# Patient Record
Sex: Male | Born: 1980 | Race: Black or African American | Hispanic: No | Marital: Single | Smoking: Former smoker
Health system: Southern US, Community
[De-identification: ages and names within clinical notes are randomized; demographics above are authoritative.]

## PROBLEM LIST (undated history)

## (undated) HISTORY — PX: FRACTURE SURGERY: SHX138

---

## 2015-04-18 ENCOUNTER — Encounter (HOSPITAL_COMMUNITY): Payer: Self-pay | Admitting: *Deleted

## 2015-04-18 ENCOUNTER — Emergency Department (HOSPITAL_COMMUNITY)
Admission: EM | Admit: 2015-04-18 | Discharge: 2015-04-18 | Disposition: A | Discharge status: Home / Self Care | Attending: Emergency Medicine | Admitting: Emergency Medicine

## 2015-04-18 DIAGNOSIS — W51XXXA Accidental striking against or bumped into by another person, initial encounter: Secondary | ICD-10-CM | POA: Insufficient documentation

## 2015-04-18 DIAGNOSIS — Z87891 Personal history of nicotine dependence: Secondary | ICD-10-CM | POA: Diagnosis not present

## 2015-04-18 DIAGNOSIS — Y9367 Activity, basketball: Secondary | ICD-10-CM | POA: Insufficient documentation

## 2015-04-18 DIAGNOSIS — Y92310 Basketball court as the place of occurrence of the external cause: Secondary | ICD-10-CM | POA: Insufficient documentation

## 2015-04-18 DIAGNOSIS — S0993XA Unspecified injury of face, initial encounter: Secondary | ICD-10-CM | POA: Diagnosis present

## 2015-04-18 DIAGNOSIS — S01111A Laceration without foreign body of right eyelid and periocular area, initial encounter: Secondary | ICD-10-CM | POA: Insufficient documentation

## 2015-04-18 DIAGNOSIS — Y998 Other external cause status: Secondary | ICD-10-CM | POA: Insufficient documentation

## 2015-04-18 MED ORDER — LIDOCAINE-EPINEPHRINE (PF) 1 %-1:200000 IJ SOLN
30.0000 mL | Freq: Once | INTRAMUSCULAR | Status: AC
  Administered 2015-04-18: 30 mL

## 2015-04-18 MED ORDER — ACETAMINOPHEN 325 MG PO TABS
650.0000 mg | ORAL_TABLET | Freq: Once | ORAL | Status: AC
  Administered 2015-04-18: 650 mg via ORAL
  Filled 2015-04-18: qty 2

## 2015-04-18 MED ORDER — LIDOCAINE-EPINEPHRINE (PF) 1 %-1:200000 IJ SOLN
INTRAMUSCULAR | Status: AC
  Administered 2015-04-18: 30 mL
  Filled 2015-04-18: qty 10

## 2015-04-18 MED ORDER — KETOROLAC TROMETHAMINE 10 MG PO TABS
10.0000 mg | ORAL_TABLET | Freq: Once | ORAL | Status: AC
  Administered 2015-04-18: 10 mg via ORAL
  Filled 2015-04-18: qty 1

## 2015-04-18 NOTE — ED Notes (Addendum)
Lac to rt eyebrow region, bumped heads with another person when playing basketball.  No LOC, no other injury  Pt is a prisoner with a guard

## 2015-04-18 NOTE — ED Provider Notes (Signed)
CSN: [id number]     Arrival date & time 04/18/15  2028 History   First MD Initiated Contact with Patient 04/18/15 2042     Chief Complaint  Patient presents with  . Facial Laceration     (Consider location/radiation/quality/duration/timing/severity/associated sxs/prior Treatment) HPI Comments: Patient is a 34 year old male resident of a local prison who presents to the emergency department in the custody of one of the prison guards. The patient states that he was playing basketball, when he bumped heads with another person and sustained a laceration to the right eyebrow. He denies loss of consciousness. He denies any vision changes. He states he has some mild soreness about the side of the face, but no other problems. There are no other lacerations or injuries noted. Patient states that his last tetanus shot has been within the last 5 years. Patient denies any bleeding disorders, and is not on any anticoagulation medications.  The history is provided by the patient.    History reviewed. No pertinent past medical history. Past Surgical History  Procedure Laterality Date  . Fracture surgery     History reviewed. No pertinent family history. History  Substance Use Topics  . Smoking status: Former Games developermoker  . Smokeless tobacco: Not on file  . Alcohol Use: No    Review of Systems  Constitutional: Negative for activity change.       All ROS Neg except as noted in HPI  HENT: Negative for nosebleeds.   Eyes: Negative for photophobia and discharge.  Respiratory: Negative for cough, shortness of breath and wheezing.   Cardiovascular: Negative for chest pain and palpitations.  Gastrointestinal: Negative for abdominal pain and blood in stool.  Genitourinary: Negative for dysuria, frequency and hematuria.  Musculoskeletal: Negative for back pain, arthralgias and neck pain.  Skin: Negative.   Neurological: Negative for dizziness, seizures and speech difficulty.  Psychiatric/Behavioral:  Negative for hallucinations and confusion.      Allergies  Review of patient's allergies indicates no known allergies.  Home Medications   Prior to Admission medications   Not on File   BP 126/86 mmHg  Pulse 91  Temp(Src) 98.8 F (37.1 C) (Oral)  Resp 18  Ht 5\' 9"  (1.753 m)  Wt 234 lb (106.142 kg)  BMI 34.54 kg/m2  SpO2 98% Physical Exam  Constitutional: He is oriented to person, place, and time. He appears well-developed and well-nourished.  Non-toxic appearance.  HENT:  Head: Normocephalic. Head is with laceration. Head is without raccoon's eyes, without Battle's sign, without right periorbital erythema and without left periorbital erythema.    Right Ear: Tympanic membrane and external ear normal.  Left Ear: Tympanic membrane and external ear normal.  Eyes: EOM and lids are normal. Pupils are equal, round, and reactive to light.  The globe is firm. The anterior chamber is clear. The extraocular movements are intact.  Neck: Normal range of motion. Neck supple. Carotid bruit is not present.  Cardiovascular: Normal rate, regular rhythm, normal heart sounds, intact distal pulses and normal pulses.   Pulmonary/Chest: Breath sounds normal. No respiratory distress.  Abdominal: Soft. Bowel sounds are normal. There is no tenderness. There is no guarding.  Musculoskeletal: Normal range of motion.  Lymphadenopathy:       Head (right side): No submandibular adenopathy present.       Head (left side): No submandibular adenopathy present.    He has no cervical adenopathy.  Neurological: He is alert and oriented to person, place, and time. He has normal strength. No cranial  nerve deficit or sensory deficit.  Skin: Skin is warm and dry.  Psychiatric: He has a normal mood and affect. His speech is normal.  Nursing note and vitals reviewed.   ED Course  LACERATION REPAIR Date/Time: 04/18/2015 9:32 PM Performed by: Ivery QualeBRYANT, Ilma Achee Authorized by: Ivery QualeBRYANT, Luster Hechler Consent: Verbal consent  obtained. Risks and benefits: risks, benefits and alternatives were discussed Consent given by: patient Patient understanding: patient states understanding of the procedure being performed Patient identity confirmed: arm band Time out: Immediately prior to procedure a "time out" was called to verify the correct patient, procedure, equipment, support staff and site/side marked as required. Body area: head/neck Location details: right eyebrow Laceration length: 2.2 cm Foreign bodies: no foreign bodies Anesthesia: local infiltration Local anesthetic: lidocaine 1% with epinephrine Anesthetic total: 2.5 ml Patient sedated: no Preparation: Patient was prepped and draped in the usual sterile fashion. Irrigation solution: saline Amount of cleaning: standard Skin closure: 5-0 Prolene Number of sutures: 4 Technique: simple Approximation: close Approximation difficulty: simple Dressing: bandaid. Patient tolerance: Patient tolerated the procedure well with no immediate complications   (including critical care time) Labs Review Labs Reviewed - No data to display  Imaging Review No results found.   EKG Interpretation None      MDM  Patient sustained a 2.2 cm laceration involving the right eyebrow. No changes in the eye examination. Vital signs are stable. The laceration was repaired with 4 interrupted sutures of 5-0 Prolene. Band-Aid applied to the wound. Patient tolerated the procedure without problem. I've asked the patient have the sutures removed in the next 5-6 days. He is to see the prison physician, or return to the emergency department if any signs of infection.    Final diagnoses:  None    **I have reviewed nursing notes, vital signs, and all appropriate lab and imaging results for this patient.Ivery Quale*    Tanikka Bresnan, PA-C 04/18/15 2136  Zadie Rhineonald Wickline, MD 04/18/15 [phone]

## 2015-04-18 NOTE — ED Notes (Signed)
[facility] advise that pt was being [facility]. [phone]

## 2015-04-18 NOTE — Discharge Instructions (Signed)
Please keep the wound clean and dry. Please have the sutures removed in 5 or 6 days. Please see the prison MD or return to the ED if any signs of infection. Sutured Wound Care Sutures are stitches that can be used to close wounds. Wound care helps prevent pain and infection.  HOME CARE INSTRUCTIONS   Rest and elevate the injured area until all the pain and swelling are gone.  Only take over-the-counter or prescription medicines for pain, discomfort, or fever as directed by your caregiver.  After 48 hours, gently wash the area with mild soap and water once a day, or as directed. Rinse off the soap. Pat the area dry with a clean towel. Do not rub the wound. This may cause bleeding.  Follow your caregiver's instructions for how often to change the bandage (dressing). Stop using a dressing after 2 days or after the wound stops draining.  If the dressing sticks, moisten it with soapy water and gently remove it.  Apply ointment on the wound as directed.  Avoid stretching a sutured wound.  Drink enough fluids to keep your urine clear or pale yellow.  Follow up with your caregiver for suture removal as directed.  Use sunscreen on your wound for the next 3 to 6 months so the scar will not darken. SEEK IMMEDIATE MEDICAL CARE IF:   Your wound becomes red, swollen, hot, or tender.  You have increasing pain in the wound.  You have a red streak that extends from the wound.  There is pus coming from the wound.  You have a fever.  You have shaking chills.  There is a bad smell coming from the wound.  You have persistent bleeding from the wound. MAKE SURE YOU:   Understand these instructions.  Will watch your condition.  Will get help right away if you are not doing well or get worse. Document Released: 01/06/2005 Document Revised: 02/21/2012 Document Reviewed: 04/04/2011 [facility] Patient Information 2015 [place], [place]. This information is not intended to replace advice given to you  by your health care provider. Make sure you discuss any questions you have with your health care provider.

## 2015-09-01 ENCOUNTER — Emergency Department (HOSPITAL_COMMUNITY)

## 2015-09-01 ENCOUNTER — Encounter (HOSPITAL_COMMUNITY): Payer: Self-pay | Admitting: *Deleted

## 2015-09-01 ENCOUNTER — Emergency Department (HOSPITAL_COMMUNITY)
Admission: EM | Admit: 2015-09-01 | Discharge: 2015-09-01 | Disposition: A | Discharge status: Home / Self Care | Attending: Emergency Medicine | Admitting: Emergency Medicine

## 2015-09-01 ENCOUNTER — Other Ambulatory Visit: Payer: Self-pay

## 2015-09-01 DIAGNOSIS — R5383 Other fatigue: Secondary | ICD-10-CM | POA: Insufficient documentation

## 2015-09-01 DIAGNOSIS — Z87891 Personal history of nicotine dependence: Secondary | ICD-10-CM | POA: Insufficient documentation

## 2015-09-01 DIAGNOSIS — R079 Chest pain, unspecified: Secondary | ICD-10-CM | POA: Insufficient documentation

## 2015-09-01 DIAGNOSIS — H538 Other visual disturbances: Secondary | ICD-10-CM | POA: Insufficient documentation

## 2015-09-01 DIAGNOSIS — R0602 Shortness of breath: Secondary | ICD-10-CM | POA: Insufficient documentation

## 2015-09-01 DIAGNOSIS — M549 Dorsalgia, unspecified: Secondary | ICD-10-CM | POA: Insufficient documentation

## 2015-09-01 DIAGNOSIS — I10 Essential (primary) hypertension: Secondary | ICD-10-CM | POA: Insufficient documentation

## 2015-09-01 LAB — CBC WITH DIFFERENTIAL/PLATELET
BASOS PCT: 0 %
Basophils Absolute: 0 10*3/uL (ref 0.0–0.1)
EOS ABS: 0.1 10*3/uL (ref 0.0–0.7)
Eosinophils Relative: 2 %
HEMATOCRIT: 43.9 % (ref 39.0–52.0)
Hemoglobin: 15.6 g/dL (ref 13.0–17.0)
Lymphocytes Relative: 49 %
Lymphs Abs: 3.4 10*3/uL (ref 0.7–4.0)
MCH: 32.8 pg (ref 26.0–34.0)
MCHC: 35.5 g/dL (ref 30.0–36.0)
MCV: 92.4 fL (ref 78.0–100.0)
Monocytes Absolute: 0.4 10*3/uL (ref 0.1–1.0)
Monocytes Relative: 6 %
NEUTROS PCT: 43 %
Neutro Abs: 3 10*3/uL (ref 1.7–7.7)
Platelets: 241 10*3/uL (ref 150–400)
RBC: 4.75 MIL/uL (ref 4.22–5.81)
RDW: 12 % (ref 11.5–15.5)
WBC: 6.9 10*3/uL (ref 4.0–10.5)

## 2015-09-01 LAB — BASIC METABOLIC PANEL
Anion gap: 5 (ref 5–15)
BUN: 6 mg/dL (ref 6–20)
CALCIUM: 8.9 mg/dL (ref 8.9–10.3)
CHLORIDE: 101 mmol/L (ref 101–111)
CO2: 30 mmol/L (ref 22–32)
Creatinine, Ser: 0.95 mg/dL (ref 0.61–1.24)
GFR calc non Af Amer: >60 mL/min (ref >60)
Glucose, Bld: 83 mg/dL (ref 65–99)
Potassium: 4.1 mmol/L (ref 3.5–5.1)
SODIUM: 136 mmol/L (ref 135–145)

## 2015-09-01 LAB — I-STAT TROPONIN, ED: TROPONIN I, POC: 0 ng/mL (ref 0.00–0.08)

## 2015-09-01 LAB — D-DIMER, QUANTITATIVE (NOT AT ARMC): D DIMER QUANT: 0.32 ug{FEU}/mL (ref 0.00–0.48)

## 2015-09-01 MED ORDER — IBUPROFEN 800 MG PO TABS: 800.0000 mg | ORAL_TABLET | Freq: Three times a day (TID) | ORAL | Status: AC

## 2015-09-01 NOTE — ED Provider Notes (Signed)
CSN: [id number]     Arrival date & time 09/01/15  1859 History   First MD Initiated Contact with Patient 09/01/15 2000     Chief Complaint  Patient presents with  . Chest Pain     (Consider location/radiation/quality/duration/timing/severity/associated sxs/prior Treatment) Patient is a 34 y.o. male presenting with chest pain. The history is provided by the patient.  Chest Pain Associated symptoms: back pain, fatigue and shortness of breath   Associated symptoms: no abdominal pain, no fever, no headache, no nausea and not vomiting    patient brought in by prison authorities for the complaint of chest pain 1 month ago worse today at 10 in the morning. Patient feeling tired. Associated with shortness of breath. Denies any fevers denies any leg swelling. Has not been evaluated for this in the past.  History reviewed. No pertinent past medical history. Past Surgical History  Procedure Laterality Date  . Fracture surgery     History reviewed. No pertinent family history. Social History  Substance Use Topics  . Smoking status: Former Games developer  . Smokeless tobacco: None  . Alcohol Use: No    Review of Systems  Constitutional: Positive for fatigue. Negative for fever.  HENT: Negative for congestion.   Eyes: Positive for visual disturbance.  Respiratory: Positive for shortness of breath.   Cardiovascular: Positive for chest pain.  Gastrointestinal: Negative for nausea, vomiting and abdominal pain.  Genitourinary: Negative for dysuria.  Musculoskeletal: Positive for back pain.  Skin: Negative for rash.  Neurological: Negative for headaches.  Hematological: Does not bruise/bleed easily.  Psychiatric/Behavioral: Negative for confusion.      Allergies  Review of patient's allergies indicates no known allergies.  Home Medications   Prior to Admission medications   Medication Sig Start Date End Date Taking? Authorizing Provider  acetaminophen (NON-ASPIRIN) 325 MG tablet Take 650  mg by mouth every 6 (six) hours as needed for mild pain or moderate pain.   Yes Historical Provider, MD  ibuprofen (ADVIL,MOTRIN) 800 MG tablet Take 1 tablet (800 mg total) by mouth 3 (three) times daily. 09/01/15   Vanetta Mulders, MD   BP 137/97 mmHg  Pulse 68  Temp(Src) 98.2 F (36.8 C) (Oral)  Resp 17  Ht  (1.753 m)  Wt 235 lb (106.595 kg)  BMI 34.69 kg/m2  SpO2 98% Physical Exam  Constitutional: He is oriented to person, place, and time. He appears well-developed and well-nourished. No distress.  HENT:  Head: Normocephalic and atraumatic.  Eyes: Conjunctivae and EOM are normal. Pupils are equal, round, and reactive to light.  Neck: Normal range of motion.  Cardiovascular: Normal rate, regular rhythm and normal heart sounds.   No murmur heard. Pulmonary/Chest: Effort normal and breath sounds normal. No respiratory distress. He has no wheezes. He exhibits no tenderness.  Abdominal: Soft. Bowel sounds are normal. There is no tenderness.  Musculoskeletal: Normal range of motion. He exhibits no edema.  Neurological: He is alert and oriented to person, place, and time. No cranial nerve deficit. He exhibits normal muscle tone. Coordination normal.  Skin: Skin is warm. No rash noted.  Nursing note and vitals reviewed.   ED Course  Procedures (including critical care time) Labs Review Labs Reviewed  BASIC METABOLIC PANEL  CBC WITH DIFFERENTIAL/PLATELET  D-DIMER, QUANTITATIVE (NOT AT [facility])  I-STAT TROPOININ, ED   Results for orders placed or performed during the hospital encounter of 09/01/15  Basic metabolic panel  Result Value Ref Range   Sodium 136 135 - 145 mmol/L  Potassium 4.1 3.5 - 5.1 mmol/L   Chloride 101 101 - 111 mmol/L   CO2 30 22 - 32 mmol/L   Glucose, Bld 83 65 - 99 mg/dL   BUN 6 6 - 20 mg/dL   Creatinine, Ser 1.61 0.61 - 1.24 mg/dL   Calcium 8.9 8.9 - 09.6 mg/dL   GFR calc non Af Amer >60 >60 mL/min   GFR calc Af Amer >60 >60 mL/min   Anion gap 5 5 -  15  CBC with Differential/Platelet  Result Value Ref Range   WBC 6.9 4.0 - 10.5 K/uL   RBC 4.75 4.22 - 5.81 MIL/uL   Hemoglobin 15.6 13.0 - 17.0 g/dL   HCT 04.5 40.9 - 81.1 %   MCV 92.4 78.0 - 100.0 fL   MCH 32.8 26.0 - 34.0 pg   MCHC 35.5 30.0 - 36.0 g/dL   RDW 91.4 78.2 - 95.6 %   Platelets 241 150 - 400 K/uL   Neutrophils Relative % 43 %   Neutro Abs 3.0 1.7 - 7.7 K/uL   Lymphocytes Relative 49 %   Lymphs Abs 3.4 0.7 - 4.0 K/uL   Monocytes Relative 6 %   Monocytes Absolute 0.4 0.1 - 1.0 K/uL   Eosinophils Relative 2 %   Eosinophils Absolute 0.1 0.0 - 0.7 K/uL   Basophils Relative 0 %   Basophils Absolute 0.0 0.0 - 0.1 K/uL  D-dimer, quantitative (not at [facility])  Result Value Ref Range   D-Dimer, Quant 0.32 0.00 - 0.48 ug/mL-FEU  I-Stat Troponin, ED (not at [facility])  Result Value Ref Range   Troponin i, poc 0.00 0.00 - 0.08 ng/mL   Comment 3             Imaging Review Dg Chest 2 View  09/01/2015   CLINICAL DATA:  Chest pain and shortness of breath for 1 month.  EXAM: CHEST  2 VIEW  COMPARISON:  None.  FINDINGS: The cardiomediastinal silhouette is unremarkable.  Minimal left basilar atelectasis noted.  There is no evidence of focal airspace disease, pulmonary edema, suspicious pulmonary nodule/mass, pleural effusion, or pneumothorax. No acute bony abnormalities are identified.  IMPRESSION: Minimal left basilar atelectasis without other significant abnormality.   Electronically Signed   By: Harmon Pier M.D.   On: 09/01/2015 21:34   I have personally reviewed and evaluated these images and lab results as part of my medical decision-making.   EKG Interpretation None       ED ECG REPORT   Date: 09/01/2015  Rate: 65  Rhythm: normal sinus rhythm  QRS Axis: normal  Intervals: normal  ST/T Wave abnormalities: nonspecific T wave changes  Conduction Disutrbances:none  Narrative Interpretation:   Old EKG Reviewed: none available  I have personally reviewed the EKG tracing and  agree with the computerized printout as noted.   MDM   Final diagnoses:  Chest pain, unspecified chest pain type  Essential hypertension    Patient with complaint of left-sided chest pain on and off for a month got worse today at 10 in the morning. Patient brought in by prison authorities for evaluation. Pain is left-sided. Workup to include the troponin and chest x-ray and d-dimer. Patient states associated with shortness of breath no nausea no vomiting. Workup without any acute findings. Chest x-rays negative.  Blood pressure is noted to be elevated and this will need to be followed up at the prison clinic. If it remains elevated he may require some treatment.  Vanetta Mulders, MD 09/01/15 [phone]

## 2015-09-01 NOTE — Discharge Instructions (Signed)
Workup for the chest pain without any specific findings. No evidence of blood clot in the lung based on d-dimer and chest x-ray. Cardiac marker troponin was negative EKG without any acute changes. Patient blood pressure was elevated throughout the stay this needs to be followed in the clinic if it remains elevated he may require treatment for hypertension.

## 2015-09-01 NOTE — ED Notes (Signed)
Pt c/o left sided chest pain x 1 month that has gotten worse today around 10 am; pt c/o feeling tired and having some blurred vision

## 2016-09-23 IMAGING — DX DG CHEST 2V
2 series · 2 of 2 positions shown · non-contrast
Comparison: None.

CLINICAL DATA: Chest pain and shortness of breath for 1 month.

EXAM:
CHEST  2 VIEW

[chest pa]
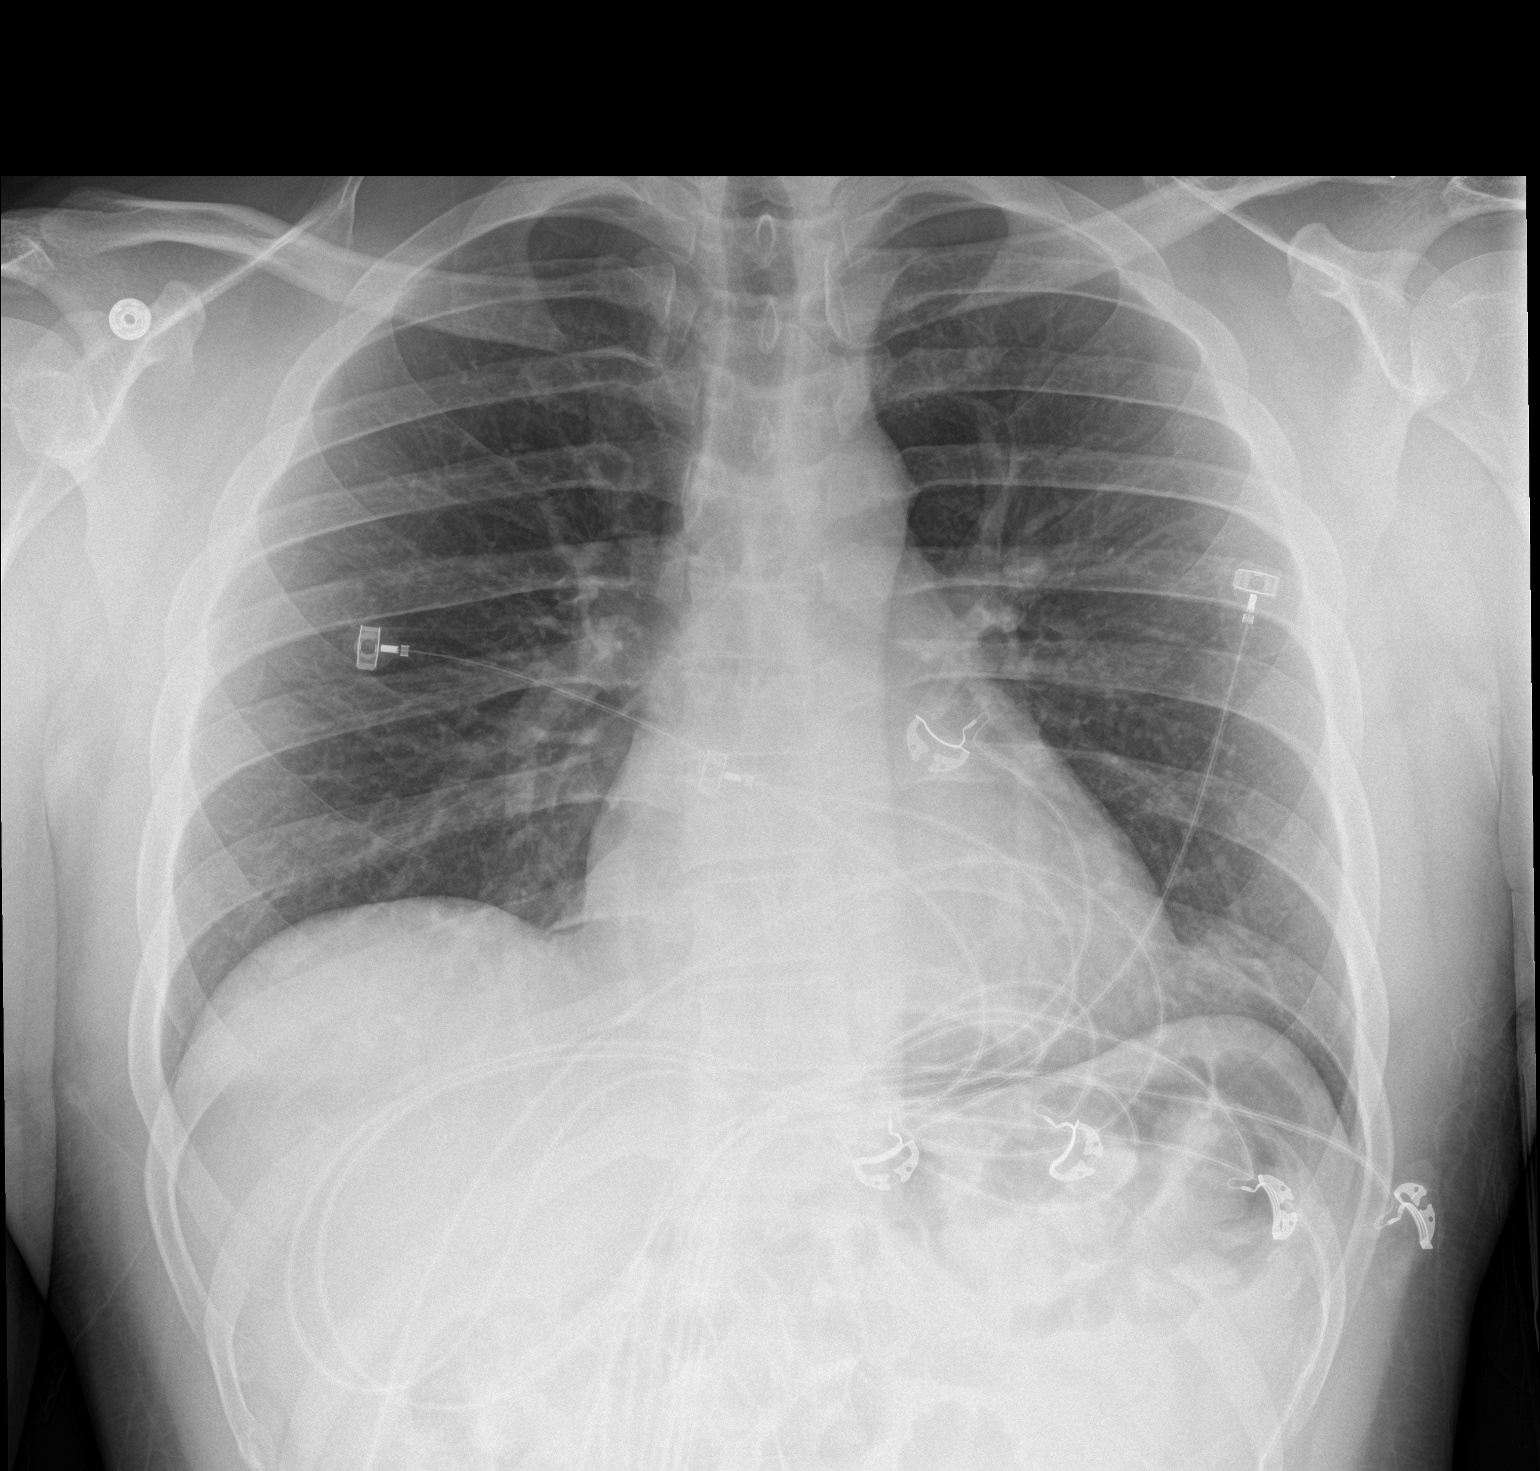

[chest lat]
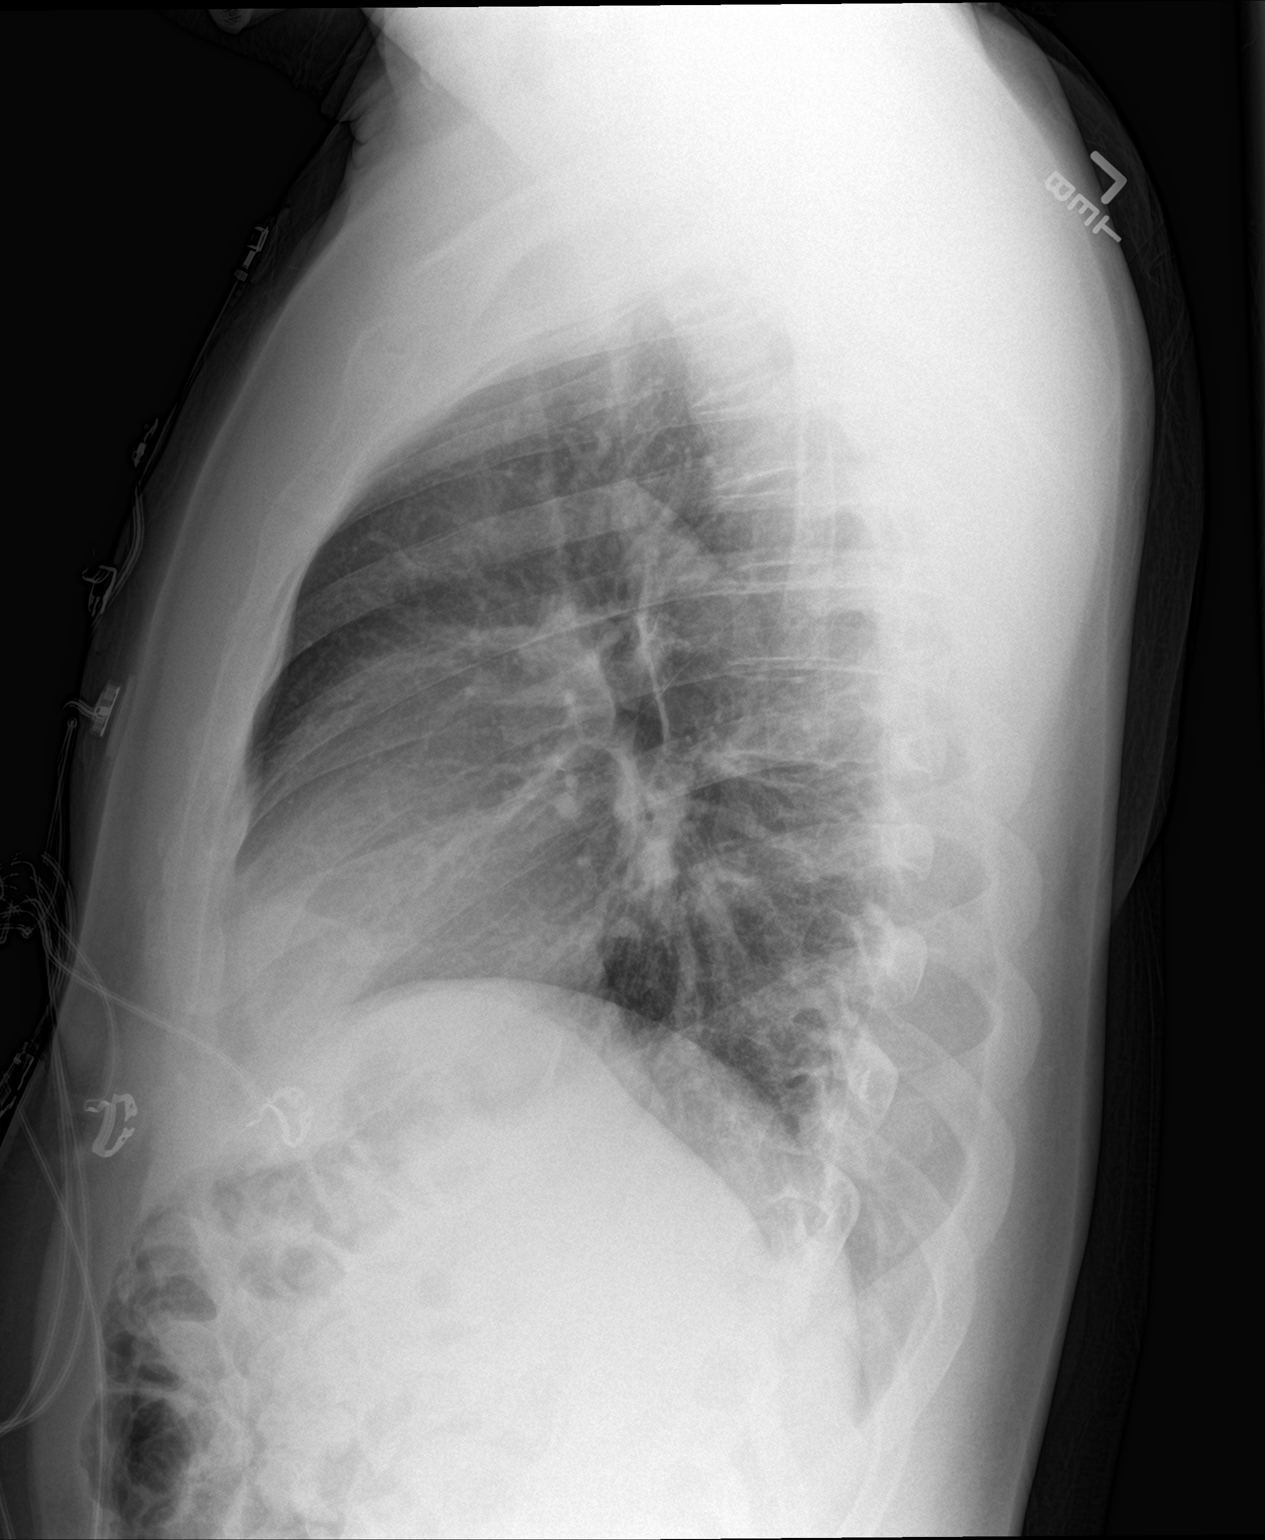

[2 of 2 positions shown; findings below may reference images not displayed]

FINDINGS: The cardiomediastinal silhouette is unremarkable.

Minimal left basilar atelectasis noted.

There is no evidence of focal airspace disease, pulmonary edema,
suspicious pulmonary nodule/mass, pleural effusion, or pneumothorax.
No acute bony abnormalities are identified.
IMPRESSION: Minimal left basilar atelectasis without other significant
abnormality.

## 2016-10-05 ENCOUNTER — Emergency Department (HOSPITAL_COMMUNITY)
Admission: EM | Admit: 2016-10-05 | Discharge: 2016-10-05 | Disposition: A | Discharge status: Home / Self Care | Attending: Emergency Medicine | Admitting: Emergency Medicine

## 2016-10-05 ENCOUNTER — Encounter (HOSPITAL_COMMUNITY): Payer: Self-pay | Admitting: Emergency Medicine

## 2016-10-05 ENCOUNTER — Emergency Department (HOSPITAL_COMMUNITY)

## 2016-10-05 DIAGNOSIS — Z791 Long term (current) use of non-steroidal anti-inflammatories (NSAID): Secondary | ICD-10-CM | POA: Insufficient documentation

## 2016-10-05 DIAGNOSIS — Z79899 Other long term (current) drug therapy: Secondary | ICD-10-CM | POA: Diagnosis not present

## 2016-10-05 DIAGNOSIS — Z87891 Personal history of nicotine dependence: Secondary | ICD-10-CM | POA: Diagnosis not present

## 2016-10-05 DIAGNOSIS — R0789 Other chest pain: Secondary | ICD-10-CM | POA: Insufficient documentation

## 2016-10-05 DIAGNOSIS — R079 Chest pain, unspecified: Secondary | ICD-10-CM | POA: Diagnosis present

## 2016-10-05 LAB — TROPONIN I: Troponin I: <0.03 ng/mL (ref <0.03)

## 2016-10-05 LAB — BASIC METABOLIC PANEL
Anion gap: 5 (ref 5–15)
BUN: 7 mg/dL (ref 6–20)
CALCIUM: 9 mg/dL (ref 8.9–10.3)
CO2: 26 mmol/L (ref 22–32)
Chloride: 105 mmol/L (ref 101–111)
Creatinine, Ser: 0.69 mg/dL (ref 0.61–1.24)
GFR calc non Af Amer: >60 mL/min (ref >60)
Glucose, Bld: 96 mg/dL (ref 65–99)
Potassium: 3.9 mmol/L (ref 3.5–5.1)
SODIUM: 136 mmol/L (ref 135–145)

## 2016-10-05 LAB — CBC
HCT: 41.2 % (ref 39.0–52.0)
Hemoglobin: 14.2 g/dL (ref 13.0–17.0)
MCH: 32 pg (ref 26.0–34.0)
MCHC: 34.5 g/dL (ref 30.0–36.0)
MCV: 92.8 fL (ref 78.0–100.0)
PLATELETS: 221 10*3/uL (ref 150–400)
RBC: 4.44 MIL/uL (ref 4.22–5.81)
RDW: 12.5 % (ref 11.5–15.5)
WBC: 4.3 10*3/uL (ref 4.0–10.5)

## 2016-10-05 LAB — D-DIMER, QUANTITATIVE: D-Dimer, Quant: 0.32 ug/mL-FEU (ref 0.00–0.50)

## 2016-10-05 MED ORDER — IBUPROFEN 800 MG PO TABS
800.0000 mg | ORAL_TABLET | Freq: Once | ORAL | Status: AC
  Administered 2016-10-05: 800 mg via ORAL
  Filled 2016-10-05: qty 1

## 2016-10-05 NOTE — Discharge Instructions (Signed)
There is no signs of a heart attack or blood clot in the lung. Follow-up with your cardiologist regarding your abnormal stress test. Return to the ED if you develop new or worsening symptoms.

## 2016-10-05 NOTE — ED Provider Notes (Signed)
[facility]-EMERGENCY DEPT Provider Note   CSN: [id number] Arrival date & time: 10/05/16  1035  By signing my name below, I, Sonum Patel, attest that this documentation has been prepared under the direction and in the presence of Glynn OctaveStephen Iyania Denne, MD. Electronically Signed: Sonum Patel, Neurosurgeoncribe. 10/05/16. 10:55 AM.  History   Chief Complaint Chief Complaint  Patient presents with  . Chest Pain    The history is provided by the patient. No language interpreter was used.     HPI Comments: Christopher Snyder is a 35 y.o. male who presents to the Emergency Department complaining of constant, non-radiating left chest pain with mild SOB for the past 2-3 days. He states sitting up sometimes alleviates his pain. He denies aggravating factors. He has not taken any medications for his symptoms.  He denies injury or trauma to the affected area; he denies recent weight lifting. He has had a stress test May 2017 in [place] but denies history of cardiac stents or catheterization. He denies cough, fever, abdominal pain, vomiting, nausea, diaphoresis.     History reviewed. No pertinent past medical history.  There are no active problems to display for this patient.   Past Surgical History:  Procedure Laterality Date  . FRACTURE SURGERY         Home Medications    Prior to Admission medications   Medication Sig Start Date End Date Taking? Authorizing Provider  acetaminophen (NON-ASPIRIN) 325 MG tablet Take 650 mg by mouth every 6 (six) hours as needed for mild pain or moderate pain.    Historical Provider, MD  ibuprofen (ADVIL,MOTRIN) 800 MG tablet Take 1 tablet (800 mg total) by mouth 3 (three) times daily. 09/01/15   Vanetta MuldersScott Zackowski, MD    Family History History reviewed. No pertinent family history.  Social History Social History  Substance Use Topics  . Smoking status: Former Games developermoker  . Smokeless tobacco: Not on file  . Alcohol use No     Allergies   Review of patient's allergies  indicates no known allergies.   Review of Systems Review of Systems  A complete 10 system review of systems was obtained and all systems are negative except as noted in the HPI and PMH.    Physical Exam Updated Vital Signs BP 137/100 (BP Location: Right Arm)   Pulse 70   Temp 97.7 F (36.5 C) (Oral)   Resp 18   Ht 5\' 9"  (1.753 m)   Wt 220 lb (99.8 kg)   SpO2 95%   BMI 32.49 kg/m   Physical Exam  Constitutional: He is oriented to person, place, and time. He appears well-developed and well-nourished. No distress.  HENT:  Head: Normocephalic and atraumatic.  Mouth/Throat: Oropharynx is clear and moist. No oropharyngeal exudate.  Eyes: Conjunctivae and EOM are normal. Pupils are equal, round, and reactive to light.  Neck: Normal range of motion. Neck supple.  No meningismus.  Cardiovascular: Normal rate, regular rhythm, normal heart sounds and intact distal pulses.   No murmur heard. Pulmonary/Chest: Effort normal and breath sounds normal. No respiratory distress. He exhibits tenderness (left chest wall, reproducible ).  Abdominal: Soft. There is no tenderness. There is no rebound and no guarding.  Musculoskeletal: Normal range of motion. He exhibits no edema or tenderness.  Neurological: He is alert and oriented to person, place, and time. No cranial nerve deficit. He exhibits normal muscle tone. Coordination normal.  No ataxia on finger to nose bilaterally. No pronator drift. 5/5 strength throughout. CN 2-12 intact.Equal grip  strength. Sensation intact.   Skin: Skin is warm. No rash noted.  Psychiatric: He has a normal mood and affect. His behavior is normal.  Nursing note and vitals reviewed.    ED Treatments / Results  DIAGNOSTIC STUDIES: Oxygen Saturation is 95% on RA, adequate by my interpretation.    COORDINATION OF CARE: 10:54 AM Discussed treatment plan with Christopher Snyder and Christopher agreed to plan.   Labs (all labs ordered are listed, but only abnormal results are  displayed) Labs Reviewed  BASIC METABOLIC PANEL  CBC  TROPONIN I  D-DIMER, QUANTITATIVE (NOT AT [facility])  TROPONIN I    EKG  EKG Interpretation  Date/Time:  Tuesday October 05 2016 10:45:15 EDT Ventricular Rate:  67 PR Interval:    QRS Duration: 97 QT Interval:  377 QTC Calculation: 398 R Axis:   83 Text Interpretation:  Sinus rhythm No significant change was found Confirmed by Manus Gunning  MD, Korinne Greenstein [phone]) on 10/05/2016 11:14:11 AM       Radiology Dg Chest 2 View  Result Date: 10/05/2016 CLINICAL DATA:  Left-sided chest pain. EXAM: CHEST  2 VIEW COMPARISON:  09/01/2015 FINDINGS: The heart size and mediastinal contours are within normal limits. Both lungs are clear. The visualized skeletal structures are unremarkable. IMPRESSION: No active cardiopulmonary disease. Electronically Signed   By: Elige Ko   On: 10/05/2016 11:13    Procedures Procedures (including critical care time)  Medications Ordered in ED Medications - No data to display   Initial Impression / Assessment and Plan / ED Course  I have reviewed the triage vital signs and the nursing notes.  Pertinent labs & imaging results that were available during my care of the patient were reviewed by me and considered in my medical decision making (see chart for details).  Clinical Course  Patient with 3 days of left-sided chest pain that is worse with palpation. Intermittent shortness of breath. No radiation of the pain.  EKG shows normal sinus rhythm without acute ST changes.  Exercise treadmill test from [facility] May 2017  Abnormal ETT based on significant ST segment changes  Overall, the patient's exercise capacity was good.  Baseline EKG was not normal  Troponin negative. D-dimer negative. Chest x-ray negative. Low suspicion for ACS, PE, or dissection.  Doubt ACS.  D/w Dr. Diona Browner of cardiology. He states patient should follow-up with his [facility] cardiologist regarding his abnormal stress test. No further  testing indicated today as patient's pain appears to be atypical.  Troponin negative 2. Patient's chest pain appears to be musculoskeletal. He is aware of his abnormal stress test and needs to follow-up with his [facility] cardiologist Dr. Valentino Saxon. this information was placed in documentation for his prison.  Final Clinical Impressions(s) / ED Diagnoses   Final diagnoses:  Atypical chest pain    New Prescriptions New Prescriptions   No medications on file    I personally performed the services described in this documentation, which was scribed in my presence. The recorded information has been reviewed and is accurate.    Glynn Octave, MD 10/05/16 [phone]

## 2016-10-05 NOTE — ED Triage Notes (Signed)
Pt reports cp in left chest without radiation x3 days with sob at times.  Pt alert and oriented.

## 2016-10-05 NOTE — ED Notes (Signed)
Pt made aware to return if symptoms worsen or if any life threatening symptoms occur.   

## 2017-10-28 IMAGING — DX DG CHEST 2V
2 series · 2 of 2 positions shown · non-contrast
Comparison: 09/01/2015

CLINICAL DATA: Left-sided chest pain.

EXAM:
CHEST  2 VIEW

[chest pa]
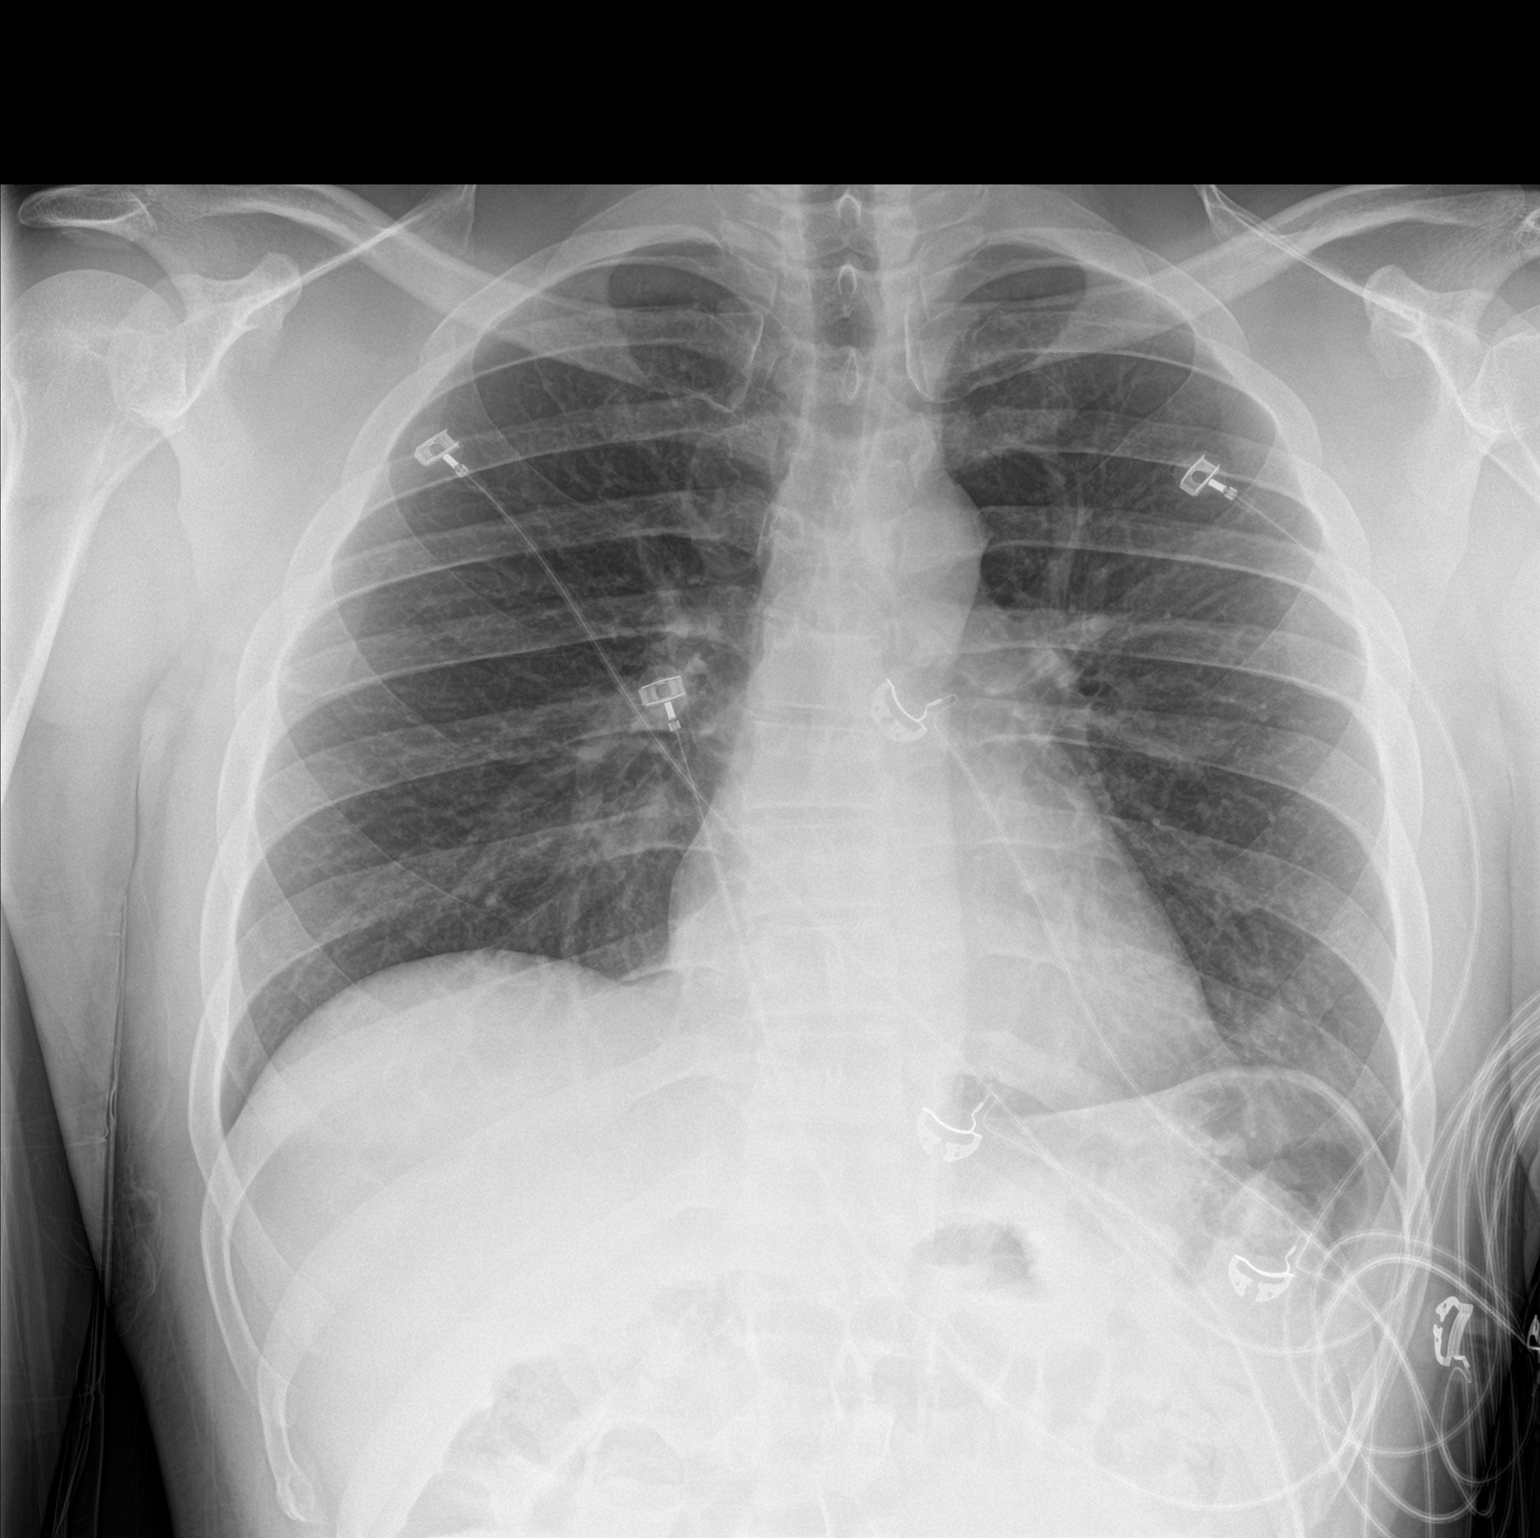

[chest lat]
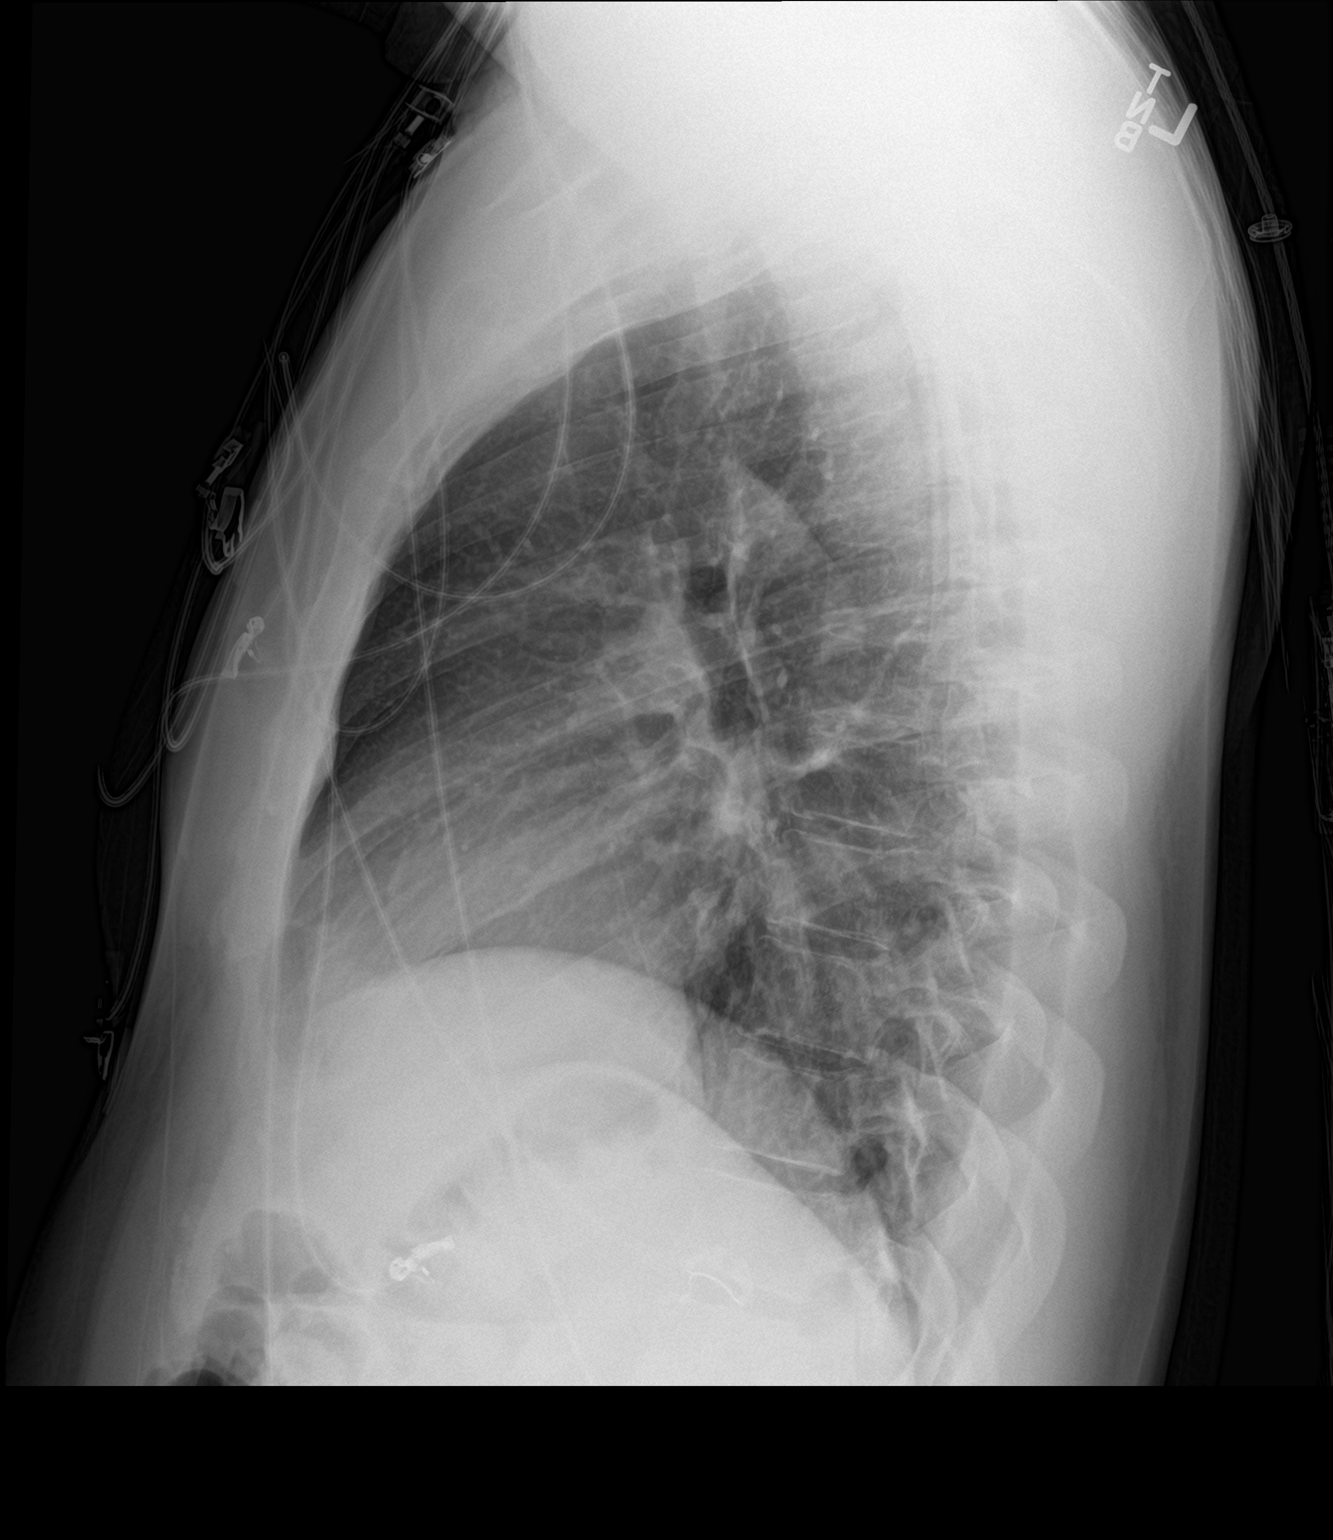

[2 of 2 positions shown; findings below may reference images not displayed]

FINDINGS: The heart size and mediastinal contours are within normal limits.
Both lungs are clear. The visualized skeletal structures are
unremarkable.
IMPRESSION: No active cardiopulmonary disease.
